# Patient Record
Sex: Male | Born: 1995 | Race: White | Hispanic: No | Marital: Single | Smoking: Never smoker
Health system: Southern US, Community
[De-identification: ages and names within clinical notes are randomized; demographics above are authoritative.]

## PROBLEM LIST (undated history)

## (undated) DIAGNOSIS — K219 Gastro-esophageal reflux disease without esophagitis: Secondary | ICD-10-CM

## (undated) HISTORY — DX: Gastro-esophageal reflux disease without esophagitis: K21.9

## (undated) HISTORY — PX: OTHER SURGICAL HISTORY: SHX169

---
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
## 2015-07-30 IMAGING — CT CT ABD-PELV W/ CM
2 of 4 series · 17 of 46 positions shown, 19 images · IV contrast (Omnipaque 300)
Comparison: No priors.

CLINICAL DATA: Persistent right lower quadrant tenderness.
Intermittent nausea since [REDACTED].

EXAM:
CT ABDOMEN AND PELVIS WITH CONTRAST
TECHNIQUE: Multidetector CT imaging of the abdomen and pelvis was performed
using the standard protocol following bolus administration of
intravenous contrast.
CONTRAST:  100mL OMNIPAQUE IOHEXOL 300 MG/ML  SOLN

[Series 2: abd/ pel 5mm · axial · 0.65mm/px · z∈[-456,-22]mm · 14 of 95 slices shown, 16 images]
[im 4/95  soft-tissue]
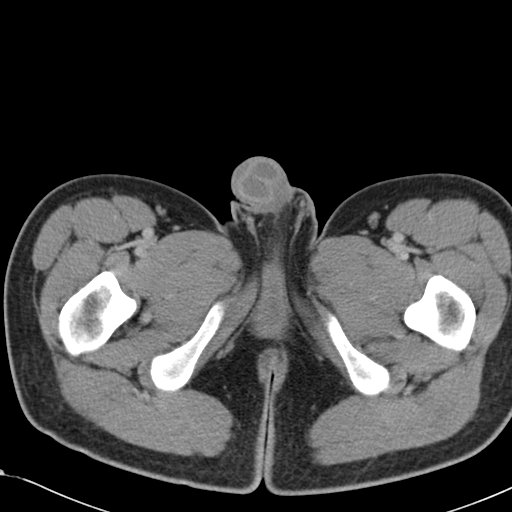
[im 4/95  bone]
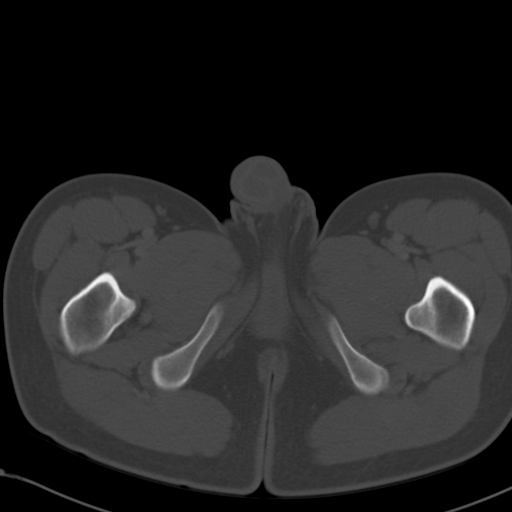
[im 12/95  soft-tissue]
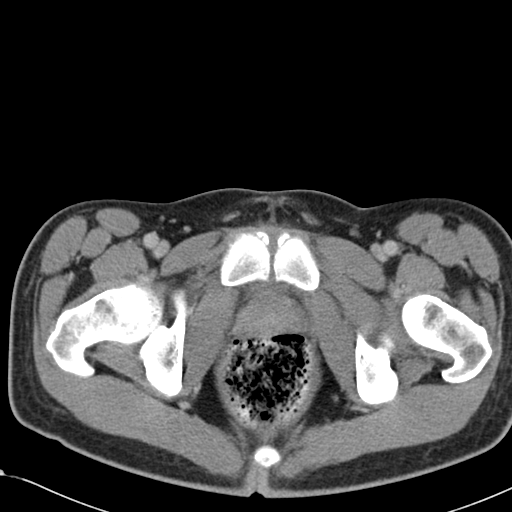
[im 20/95  soft-tissue]
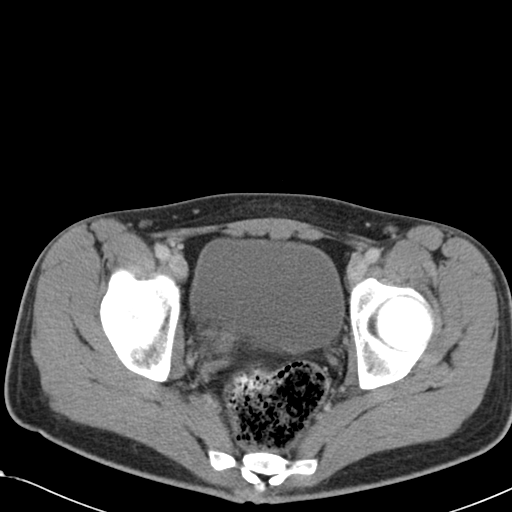
[im 24/95  soft-tissue]
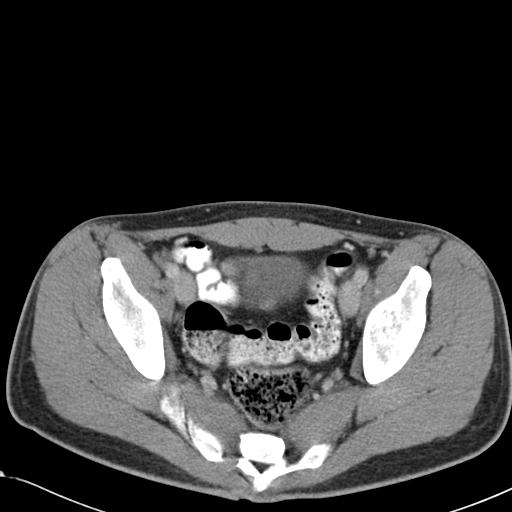
[im 32/95  soft-tissue]
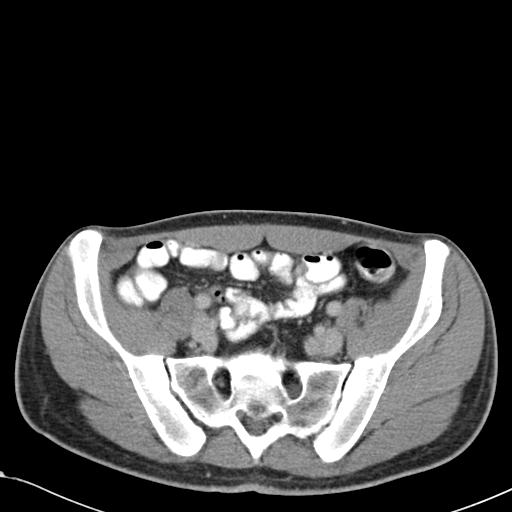
[im 40/95  soft-tissue]
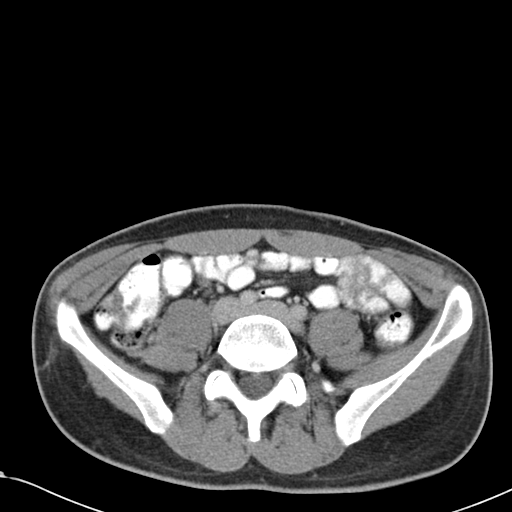
[im 44/95  soft-tissue]
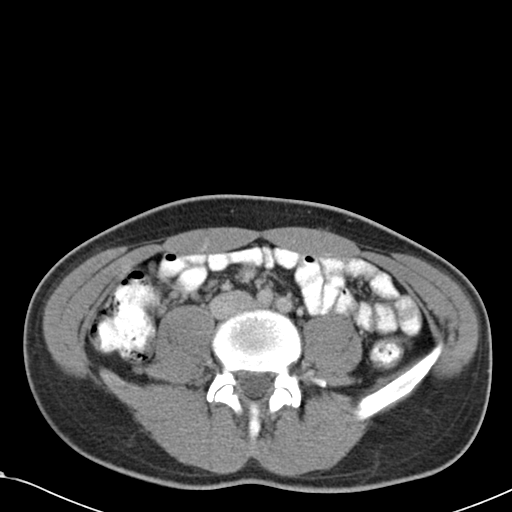
[im 51/95  soft-tissue]
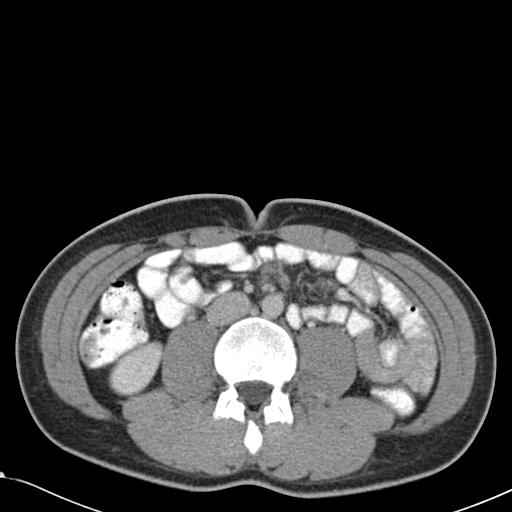
[im 55/95  soft-tissue]
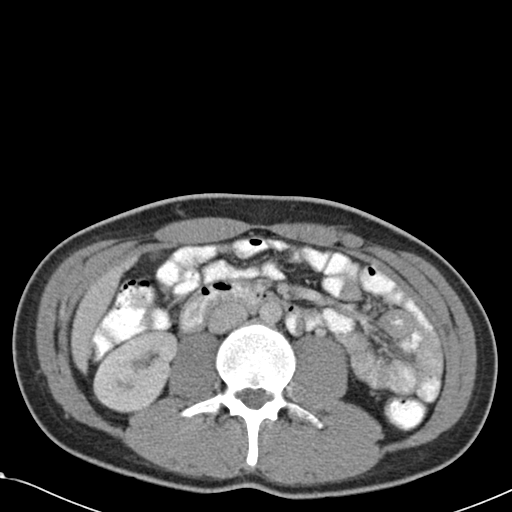
[im 55/95  bone]
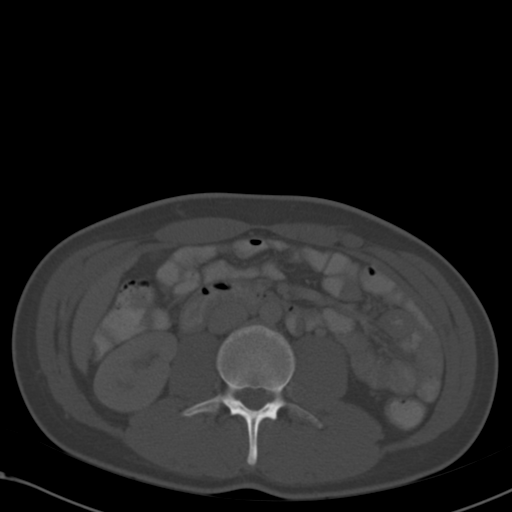
[im 63/95  soft-tissue]
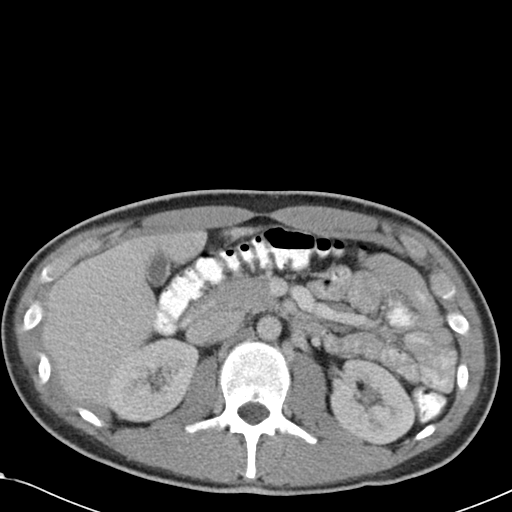
[im 71/95  soft-tissue]
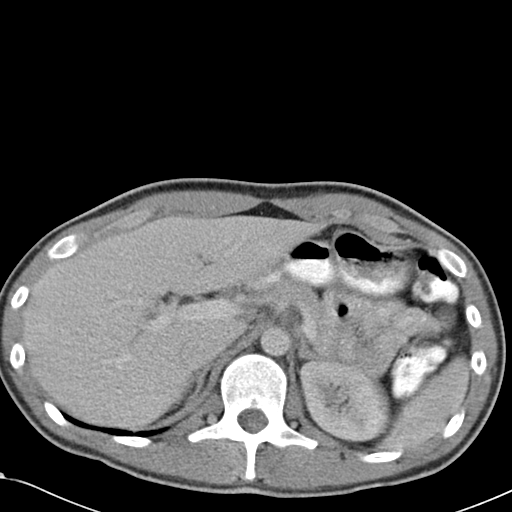
[im 75/95  soft-tissue]
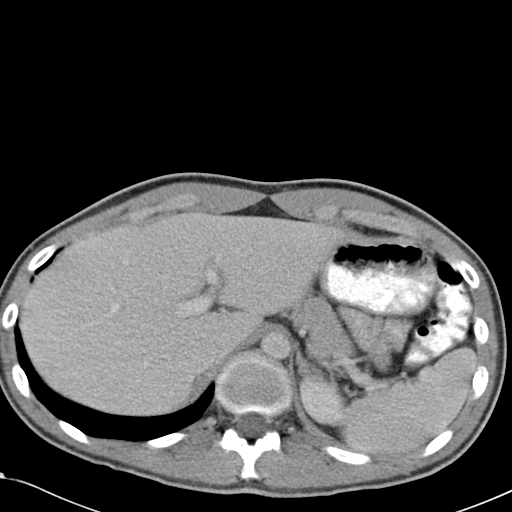
[im 83/95  soft-tissue]
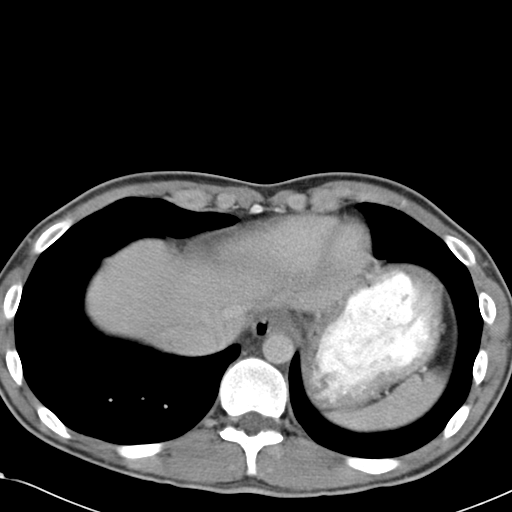
[im 91/95  soft-tissue]
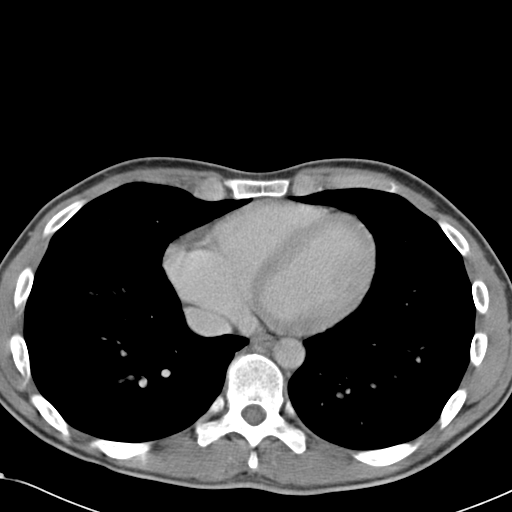

[Series 602: cor · coronal · 0.95mm/px · 3 of 101 slices shown]
[im 34/101  soft-tissue]
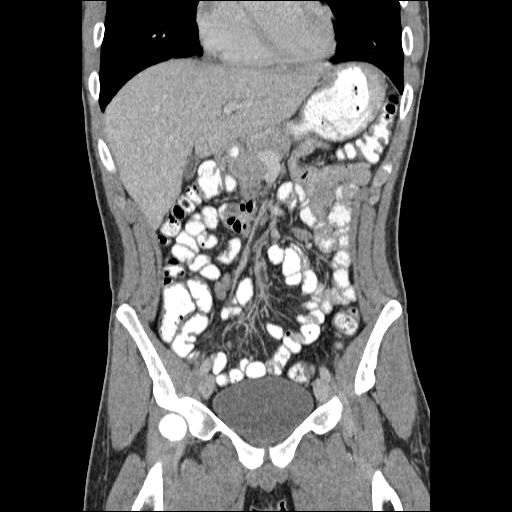
[im 45/101  soft-tissue]
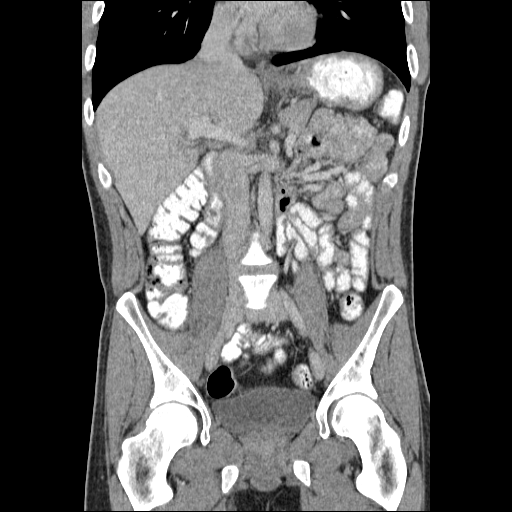
[im 56/101  soft-tissue]
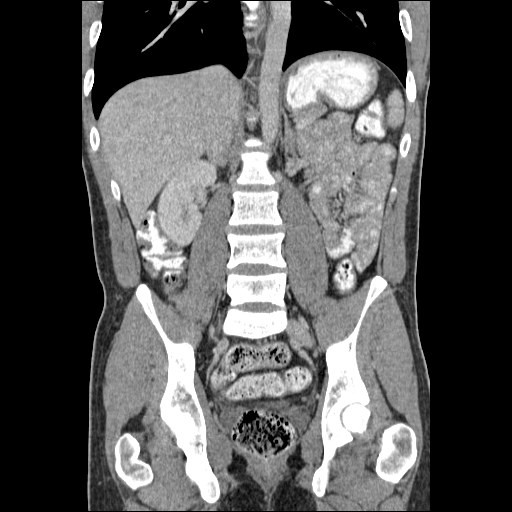

[17 of 46 positions shown; findings below may reference images not displayed]

FINDINGS: Lung Bases: Unremarkable.

Abdomen/Pelvis: The appearance of the liver, gallbladder, pancreas,
spleen, bilateral adrenal glands and bilateral kidneys is
unremarkable. No significant volume of ascites. No pneumoperitoneum.
No pathologic distention of small bowel. Normal appendix. No
lymphadenopathy identified within the abdomen or pelvis. Prostate
gland and urinary bladder are unremarkable in appearance.

Musculoskeletal: There are no aggressive appearing lytic or blastic
lesions noted in the visualized portions of the skeleton.
IMPRESSION: 1. No acute findings in the abdomen or pelvis to account for the
patient's symptoms.
2. Specifically, the appendix is normal.

## 2023-08-11 ENCOUNTER — Ambulatory Visit (HOSPITAL_BASED_OUTPATIENT_CLINIC_OR_DEPARTMENT_OTHER)
Admission: EM | Admit: 2023-08-11 | Discharge: 2023-08-11 | Disposition: A | Discharge status: Home / Self Care | Attending: Family Medicine | Admitting: Family Medicine

## 2023-08-11 ENCOUNTER — Encounter (HOSPITAL_BASED_OUTPATIENT_CLINIC_OR_DEPARTMENT_OTHER): Payer: Self-pay | Admitting: Emergency Medicine

## 2023-08-11 DIAGNOSIS — N4889 Other specified disorders of penis: Secondary | ICD-10-CM | POA: Diagnosis not present

## 2023-08-11 DIAGNOSIS — R300 Dysuria: Secondary | ICD-10-CM | POA: Diagnosis present

## 2023-08-11 LAB — POCT URINALYSIS DIP (MANUAL ENTRY)
Bilirubin, UA: NEGATIVE
Blood, UA: NEGATIVE
Glucose, UA: NEGATIVE mg/dL
Ketones, POC UA: NEGATIVE mg/dL
Leukocytes, UA: NEGATIVE
Nitrite, UA: NEGATIVE
Protein Ur, POC: NEGATIVE mg/dL
Spec Grav, UA: >=1.03 — AB
Urobilinogen, UA: 0.2 U/dL
pH, UA: 5.5

## 2023-08-11 NOTE — Discharge Instructions (Signed)
 The urine sample we did here was negative for infection or blood.  We will send a swab to rule out any kind of sexually transmitted diseases.  Most likely just about irritation.  Continue to monitor the area for any worsening symptoms please follow-up with your primary care doctor.

## 2023-08-11 NOTE — ED Triage Notes (Signed)
 Pt presents with dysuria x 2 days. Pt denies any other symptoms.

## 2023-08-11 NOTE — ED Provider Notes (Signed)
 [facility]    CSN: [id number] Arrival date & time: 08/11/23  0918      History   Chief Complaint Chief Complaint  Patient presents with   Dysuria    HPI Mark Rhodes is a 28 y.o. male.   28 year old male who presents with approximate 1 day of dysuria.  Reports started last night with mild burning with urination.  Symptoms improved this morning.  Denies any penile discharge, swelling, rash, fever, chills, abdominal pain, back pain.  Denies any specific concerns for STDs   Dysuria Presenting symptoms: dysuria     Past Medical History:  Diagnosis Date   GERD (gastroesophageal reflux disease)     Patient Active Problem List   Diagnosis Date Noted   Early satiety 06/15/2013   Nausea alone 06/15/2013   Abdominal pain, chronic, right lower quadrant 05/15/2013    Past Surgical History:  Procedure Laterality Date   none         Home Medications    Prior to Admission medications   Medication Sig Start Date End Date Taking? Authorizing Provider  omeprazole (PRILOSEC) 20 MG capsule Take 1 capsule (20 mg total) by mouth daily. 05/29/13   Zehr, Shanda Bumps D, PA-C  ondansetron (ZOFRAN) 4 MG tablet Take 1 tablet (4 mg total) by mouth every 4 (four) hours as needed for nausea or vomiting. 07/14/13   Hilarie Fredrickson, MD    Family History Family History  Problem Relation Age of Onset   Colon cancer Mother    Heart attack Father    Diabetes Neg Hx    Liver disease Neg Hx    Kidney disease Neg Hx     Social History Social History   Tobacco Use   Smoking status: Never   Smokeless tobacco: Never  Vaping Use   Vaping status: Never Used  Substance Use Topics   Alcohol use: No   Drug use: No     Allergies   Prednisone   Review of Systems Review of Systems  Genitourinary:  Positive for dysuria.     Physical Exam Triage Vital Signs ED Triage Vitals  Encounter Vitals Group     BP 08/11/23 0933 129/79     Systolic BP Percentile --      Diastolic  BP Percentile --      Pulse Rate 08/11/23 0933 82     Resp 08/11/23 0933 16     Temp 08/11/23 0933 97.8 F (36.6 C)     Temp Source 08/11/23 0933 Oral     SpO2 08/11/23 0933 98 %     Weight --      Height --      Head Circumference --      Peak Flow --      Pain Score 08/11/23 0932 5     Pain Loc --      Pain Education --      Exclude from Growth Chart --    No data found.  Updated Vital Signs BP 129/79 (BP Location: Left Arm)   Pulse 82   Temp 97.8 F (36.6 C) (Oral)   Resp 16   SpO2 98%   Visual Acuity Right Eye Distance:   Left Eye Distance:   Bilateral Distance:    Right Eye Near:   Left Eye Near:    Bilateral Near:     Physical Exam Constitutional:      Appearance: Normal appearance.  Skin:    General: Skin is warm and dry.  Neurological:     Mental Status: He is alert.  Psychiatric:        Mood and Affect: Mood normal.      UC Treatments / Results  Labs (all labs ordered are listed, but only abnormal results are displayed) Labs Reviewed  POCT URINALYSIS DIP (MANUAL ENTRY) - Abnormal; Notable for the following components:      Result Value   Spec Grav, UA >=1.030 (*)    All other components within normal limits  CYTOLOGY, (ORAL, ANAL, URETHRAL) ANCILLARY ONLY    EKG   Radiology No results found.  Procedures Procedures (including critical care time)  Medications Ordered in UC Medications - No data to display  Initial Impression / Assessment and Plan / UC Course  I have reviewed the triage vital signs and the nursing notes.  Pertinent labs & imaging results that were available during my care of the patient were reviewed by me and considered in my medical decision making (see chart for details).     Dysuria-urinalysis negative here.  Agreed to STD swab and results pending.  Appears that symptoms have improved since yesterday.  Recommend just to monitor symptoms and follow-up as needed Final Clinical Impressions(s) / UC Diagnoses    Final diagnoses:  Penile irritation     Discharge Instructions      The urine sample we did here was negative for infection or blood.  We will send a swab to rule out any kind of sexually transmitted diseases.  Most likely just about irritation.  Continue to monitor the area for any worsening symptoms please follow-up with your primary care doctor.    ED Prescriptions   None    PDMP not reviewed this encounter.   Janace Aris, FNP 08/11/23 1014

## 2023-08-13 LAB — CYTOLOGY, (ORAL, ANAL, URETHRAL) ANCILLARY ONLY
Chlamydia: NEGATIVE
Comment: NEGATIVE
Comment: NEGATIVE
Comment: NORMAL
Neisseria Gonorrhea: NEGATIVE
Trichomonas: NEGATIVE

## 2024-06-28 ENCOUNTER — Encounter (HOSPITAL_BASED_OUTPATIENT_CLINIC_OR_DEPARTMENT_OTHER): Payer: Self-pay

## 2024-06-28 ENCOUNTER — Ambulatory Visit (HOSPITAL_BASED_OUTPATIENT_CLINIC_OR_DEPARTMENT_OTHER)
Admission: EM | Admit: 2024-06-28 | Discharge: 2024-06-28 | Disposition: A | Discharge status: Home / Self Care | Attending: Family Medicine | Admitting: Family Medicine

## 2024-06-28 DIAGNOSIS — J019 Acute sinusitis, unspecified: Secondary | ICD-10-CM

## 2024-06-28 MED ORDER — AMOXICILLIN-POT CLAVULANATE 875-125 MG PO TABS: 1.0000 | ORAL_TABLET | Freq: Two times a day (BID) | ORAL | 0 refills | Status: AC

## 2024-06-28 NOTE — ED Provider Notes (Signed)
 " [facility]    CSN: [id number] Arrival date & time: 06/28/24  0854      History   Chief Complaint Chief Complaint  Patient presents with   nasal drainage   Sore Throat    HPI Mark Rhodes II is a 29 y.o. male.   Pt is a 29 year old male that presents with nasal congestion, sore throat, cough. Has been sick since Christmas with nasal congestion and intermittent sore throat. Over last 4 days, states congestion now seems to be in chest with onset of cough. Has been using OTC meds which dries up secretions but hasn't stopped the cough. Throat is sore and states sinus pressure with dental pain.     Sore Throat    Past Medical History:  Diagnosis Date   GERD (gastroesophageal reflux disease)     Patient Active Problem List   Diagnosis Date Noted   Early satiety 06/15/2013   Nausea alone 06/15/2013   Abdominal pain, chronic, right lower quadrant 05/15/2013    Past Surgical History:  Procedure Laterality Date   none         Home Medications    Prior to Admission medications  Medication Sig Start Date End Date Taking? Authorizing Provider  amoxicillin -clavulanate (AUGMENTIN ) 875-125 MG tablet Take 1 tablet by mouth every 12 (twelve) hours. 06/28/24  Yes Nashira Mcglynn A, FNP  omeprazole  (PRILOSEC) 20 MG capsule Take 1 capsule (20 mg total) by mouth daily. 05/29/13   Zehr, Jessica D, PA-C  ondansetron  (ZOFRAN ) 4 MG tablet Take 1 tablet (4 mg total) by mouth every 4 (four) hours as needed for nausea or vomiting. 07/14/13   Abran Norleen SAILOR, MD    Family History Family History  Problem Relation Age of Onset   Colon cancer Mother    Heart attack Father    Diabetes Neg Hx    Liver disease Neg Hx    Kidney disease Neg Hx     Social History Social History[1]   Allergies   Prednisone   Review of Systems Review of Systems See HPI  Physical Exam Triage Vital Signs ED Triage Vitals  Encounter Vitals Group     BP 06/28/24 0907 116/78     Girls Systolic  BP Percentile --      Girls Diastolic BP Percentile --      Boys Systolic BP Percentile --      Boys Diastolic BP Percentile --      Pulse Rate 06/28/24 0907 72     Resp 06/28/24 0907 20     Temp 06/28/24 0907 98.3 F (36.8 C)     Temp Source 06/28/24 0907 Oral     SpO2 06/28/24 0907 96 %     Weight --      Height --      Head Circumference --      Peak Flow --      Pain Score 06/28/24 0909 5     Pain Loc --      Pain Education --      Exclude from Growth Chart --    No data found.  Updated Vital Signs BP 116/78 (BP Location: Right Arm)   Pulse 72   Temp 98.3 F (36.8 C) (Oral)   Resp 20   SpO2 96%   Visual Acuity Right Eye Distance:   Left Eye Distance:   Bilateral Distance:    Right Eye Near:   Left Eye Near:    Bilateral Near:  Physical Exam Constitutional:      General: He is not in acute distress.    Appearance: Normal appearance. He is not ill-appearing, toxic-appearing or diaphoretic.  HENT:     Right Ear: Tympanic membrane, ear canal and external ear normal.     Left Ear: Tympanic membrane, ear canal and external ear normal.     Nose: Congestion present.     Mouth/Throat:     Pharynx: Oropharynx is clear.  Eyes:     Conjunctiva/sclera: Conjunctivae normal.  Cardiovascular:     Rate and Rhythm: Normal rate and regular rhythm.     Pulses: Normal pulses.     Heart sounds: Normal heart sounds.  Pulmonary:     Effort: Pulmonary effort is normal.     Breath sounds: Normal breath sounds.  Musculoskeletal:        General: Normal range of motion.  Skin:    General: Skin is warm and dry.  Neurological:     Mental Status: He is alert.  Psychiatric:        Mood and Affect: Mood normal.      UC Treatments / Results  Labs (all labs ordered are listed, but only abnormal results are displayed) Labs Reviewed - No data to display  EKG   Radiology No results found.  Procedures Procedures (including critical care time)  Medications Ordered in  UC Medications - No data to display  Initial Impression / Assessment and Plan / UC Course  I have reviewed the triage vital signs and the nursing notes.  Pertinent labs & imaging results that were available during my care of the patient were reviewed by me and considered in my medical decision making (see chart for details).     Acute sinusitis- Treating for a sinus infection. Over 3 weeks of symptoms.   Take the antibiotics as prescribed.  Can continue over-the-counter sinus medication for symptoms.  Follow-up as needed Final Clinical Impressions(s) / UC Diagnoses   Final diagnoses:  Acute non-recurrent sinusitis, unspecified location     Discharge Instructions      Treating you for sinus infection.  Take the antibiotics as prescribed.  Can continue over-the-counter sinus medication for symptoms.  Follow-up as needed     ED Prescriptions     Medication Sig Dispense Auth. Provider   amoxicillin -clavulanate (AUGMENTIN ) 875-125 MG tablet Take 1 tablet by mouth every 12 (twelve) hours. 14 tablet Adah Wilbert LABOR, FNP      PDMP not reviewed this encounter.     [1]  Social History Tobacco Use   Smoking status: Never   Smokeless tobacco: Never  Vaping Use   Vaping status: Never Used  Substance Use Topics   Alcohol use: No   Drug use: No     Adah Wilbert LABOR, FNP 06/28/24 0945  "

## 2024-06-28 NOTE — Discharge Instructions (Addendum)
 Treating you for sinus infection.  Take the antibiotics as prescribed.  Can continue over-the-counter sinus medication for symptoms.  Follow-up as needed

## 2024-06-28 NOTE — ED Triage Notes (Signed)
 Has been sick since Christmas with nasal congestion and intermittent sore throat. Over last 4 days, states congestion now seems to be in chest with onset of cough. Has been using OTC meds which dries up secretions but hasn't stopped the cough. Throat is sore and states sinus pressure with pain in my teeth.
